# Patient Record
Sex: Female | Born: 1965 | Hispanic: Yes | Marital: Married
Health system: Southern US, Community
[De-identification: ages and names within clinical notes are randomized; demographics above are authoritative.]

---

## 2005-12-30 ENCOUNTER — Inpatient Hospital Stay: Payer: Self-pay | Admitting: Obstetrics and Gynecology

## 2008-10-07 ENCOUNTER — Ambulatory Visit: Payer: Self-pay | Admitting: Certified Nurse Midwife

## 2008-10-31 ENCOUNTER — Emergency Department: Payer: Self-pay | Admitting: Emergency Medicine

## 2008-11-10 ENCOUNTER — Ambulatory Visit: Payer: Self-pay | Admitting: Surgery

## 2008-11-11 ENCOUNTER — Ambulatory Visit: Payer: Self-pay | Admitting: Surgery

## 2009-07-14 IMAGING — US US PELV - US TRANSVAGINAL
1 series · 17 of 25 positions shown · non-contrast
Comparison: none

REASON FOR EXAM: PELVIC PAIN   needs interpreter office informed
COMMENTS:

[Series 1: us pelv - us transvaginal · 17 of 35 slices shown]
[im 1/35]
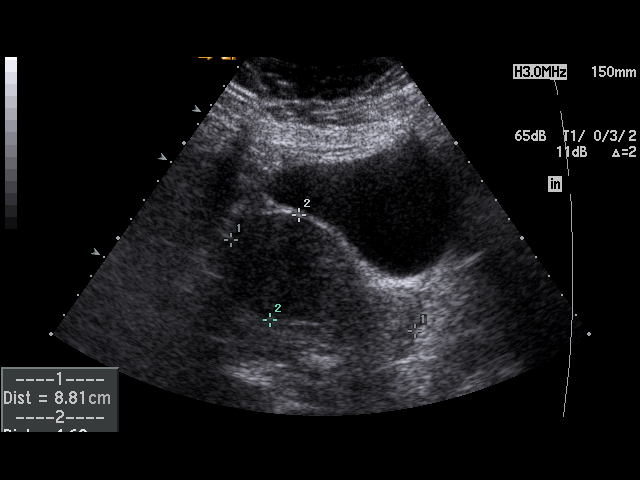
[im 3/35]
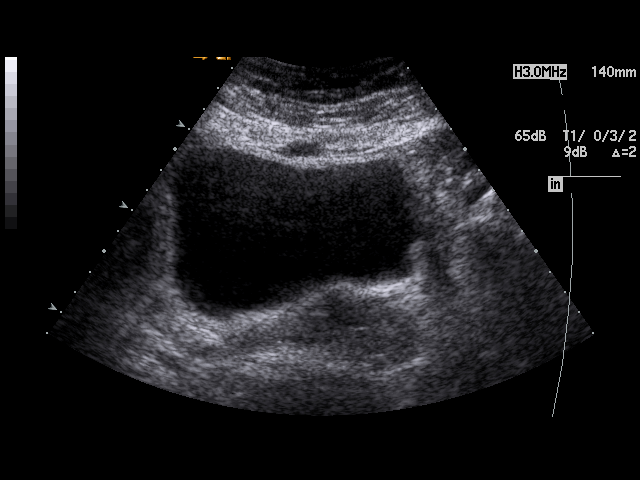
[im 5/35]
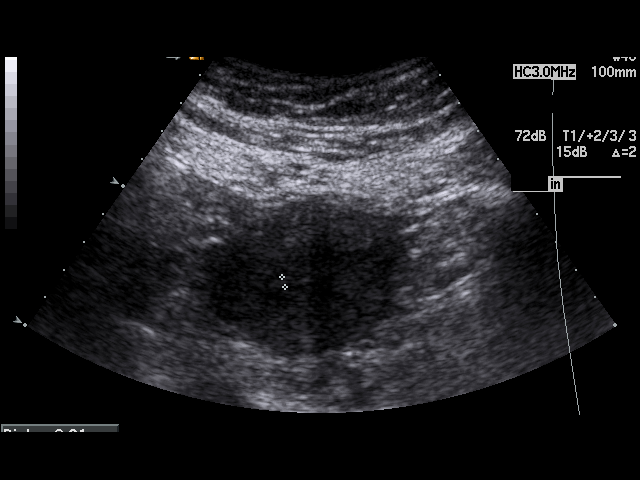
[im 8/35]
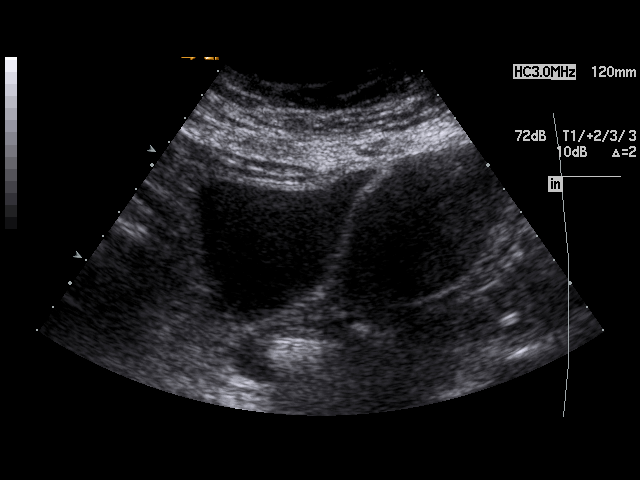
[im 9/35]
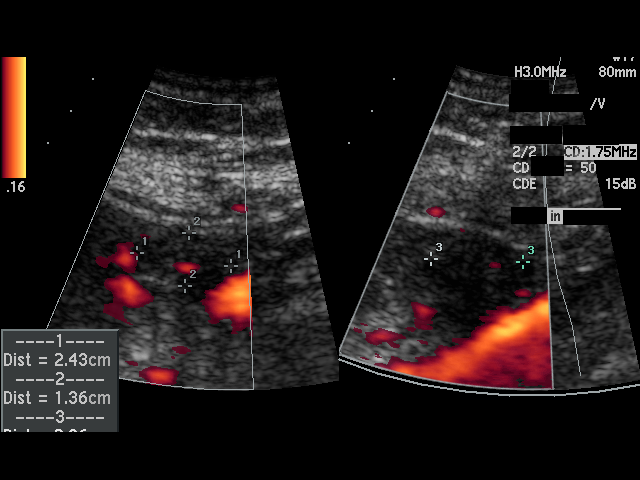
[im 12/35]
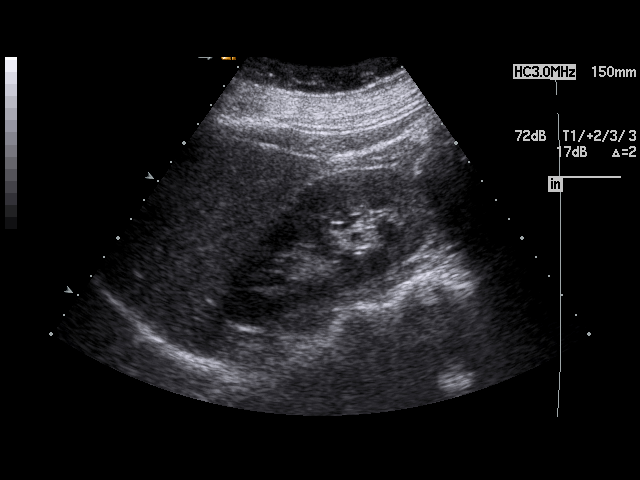
[im 13/35]
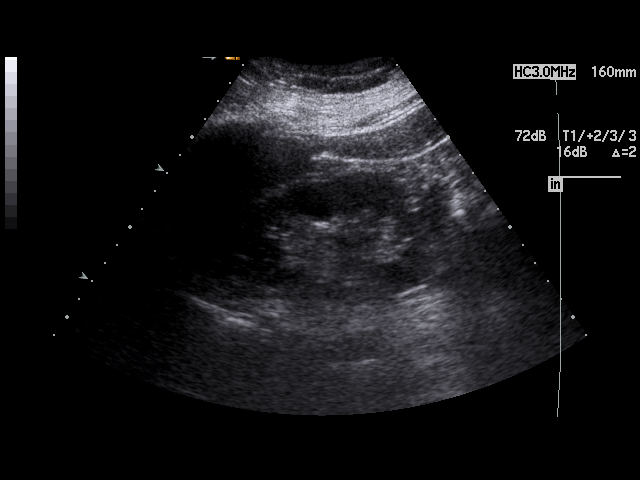
[im 16/35]
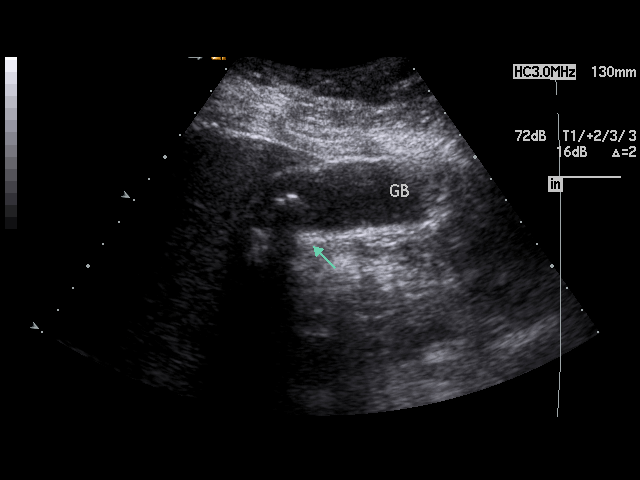
[im 18/35]
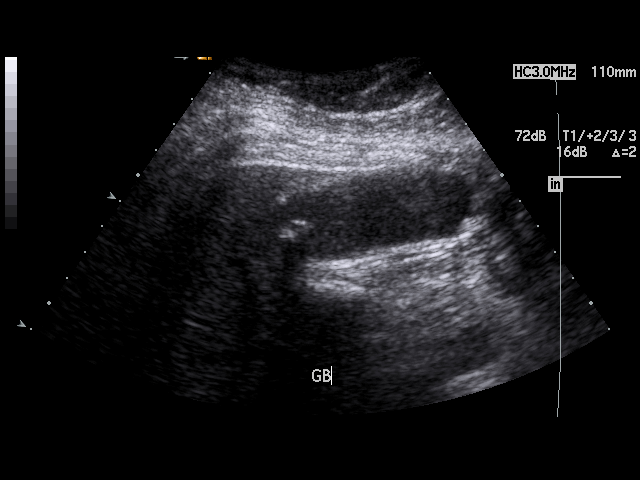
[im 19/35]
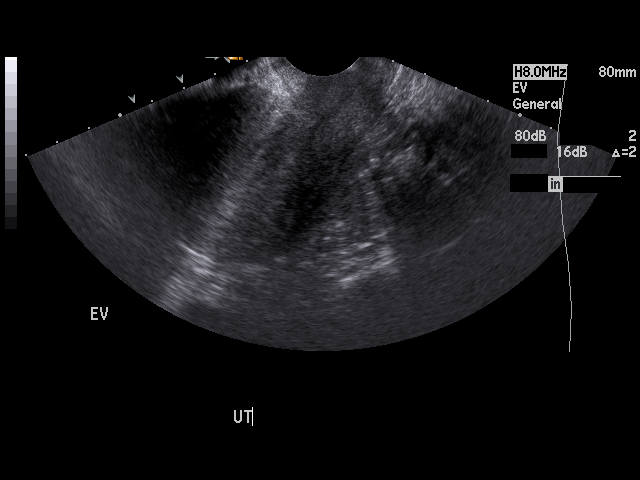
[im 22/35]
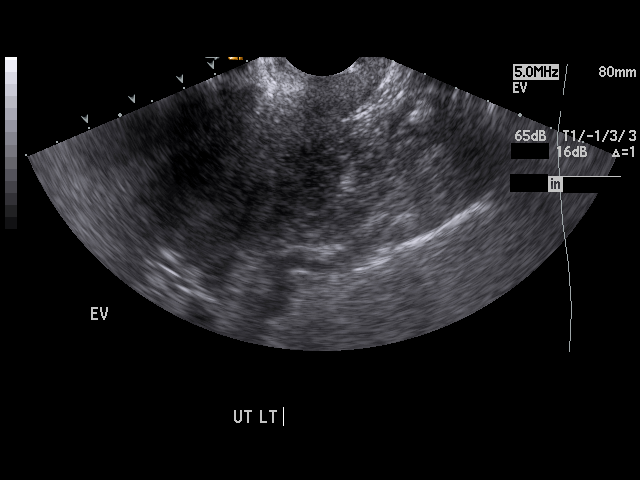
[im 23/35]
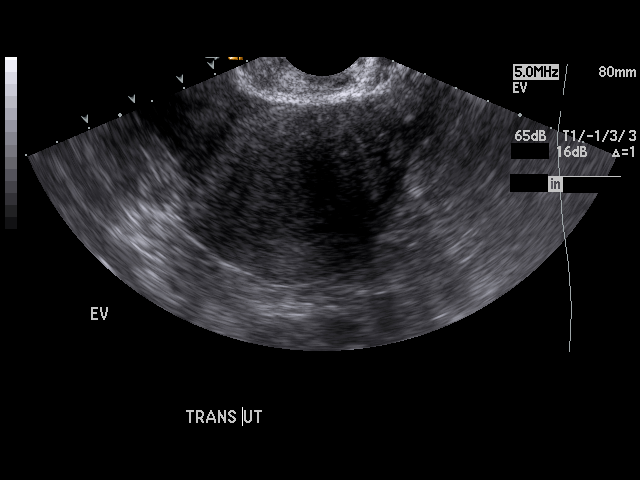
[im 26/35]
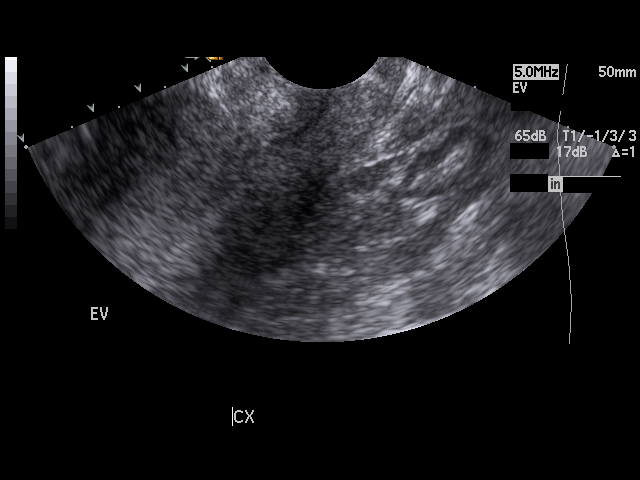
[im 27/35]
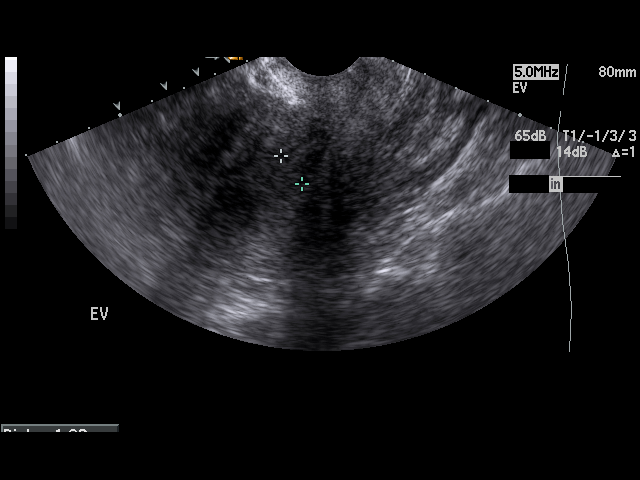
[im 30/35]
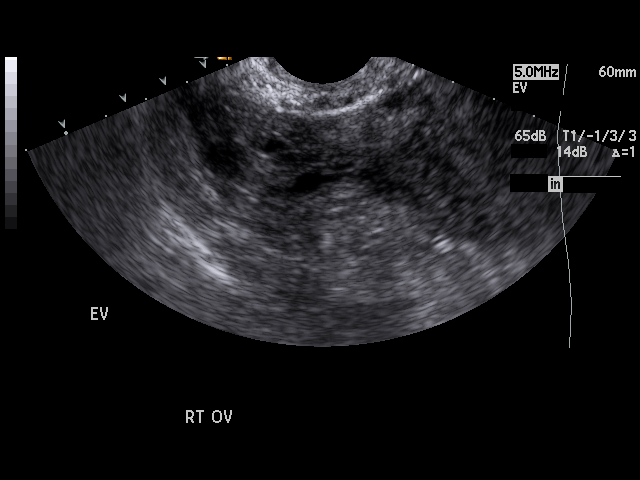
[im 32/35]
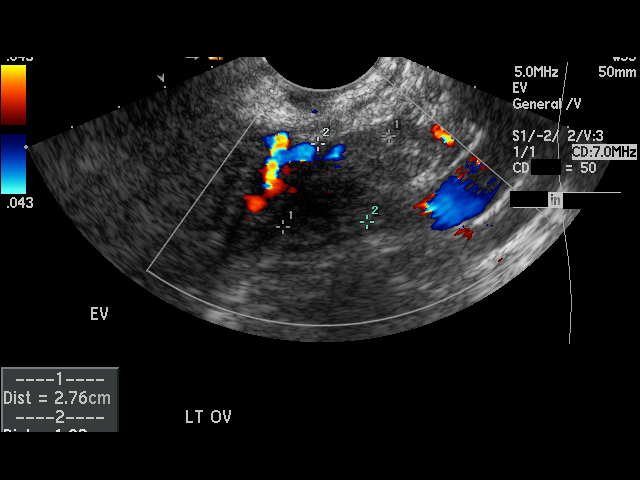
[im 35/35]
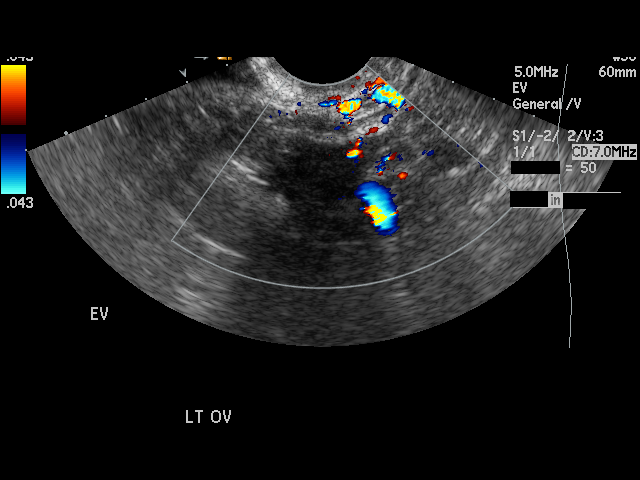

[17 of 25 positions shown; findings below may reference images not displayed]

PROCEDURE:     US  - US PELVIS EXAM W/TRANSVAGINAL  - October 07, 2008  [DATE]

RESULT:     The uterus measures 8.8 x 4.7 x 5.7 cm. Endometrial thickness of
10.2 mm is noted.  The RIGHT ovary measures 3.4 cm in maximum length and the
LEFT ovary measures 2.8 cm. A simple 0.9 cm cyst is noted in the RIGHT
ovary.  No hydronephrosis is noted. Incidental noted is made of gallstones.
IMPRESSION: 1. Endometrial thickening.  The endometrial thickening is mild and may be
cyclical. Follow-up pelvic ultrasound is suggested.

2. Simple cyst, RIGHT ovary.

3. Incidental note is made of gallstones.

## 2009-08-07 IMAGING — US ABDOMEN ULTRASOUND
1 series · 17 of 25 positions shown · non-contrast
Comparison: none

REASON FOR EXAM: ruq pain radiates to back
COMMENTS:   LMP: Now

PROCEDURE:     US  - US ABDOMEN GENERAL SURVEY  - October 31, 2008  [DATE]
RESULT:     Comparison: None
TECHNIQUE: Multiple gray-scale and color-flow Doppler images of the abdomen
are presented for review.

[Series 1: abdomen ultrasound · 17 of 77 slices shown]
[im 1/77]
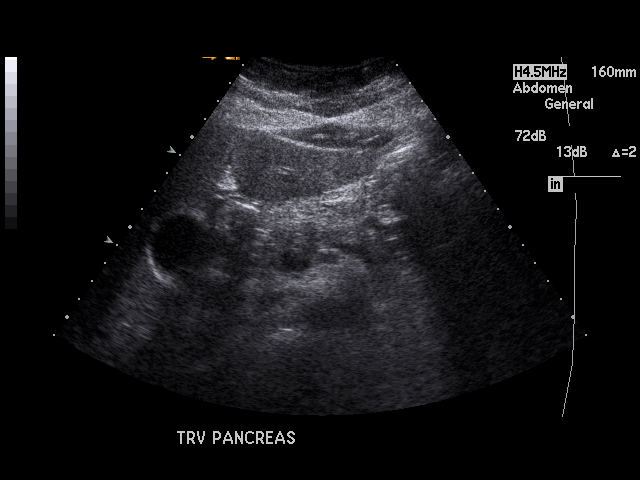
[im 7/77]
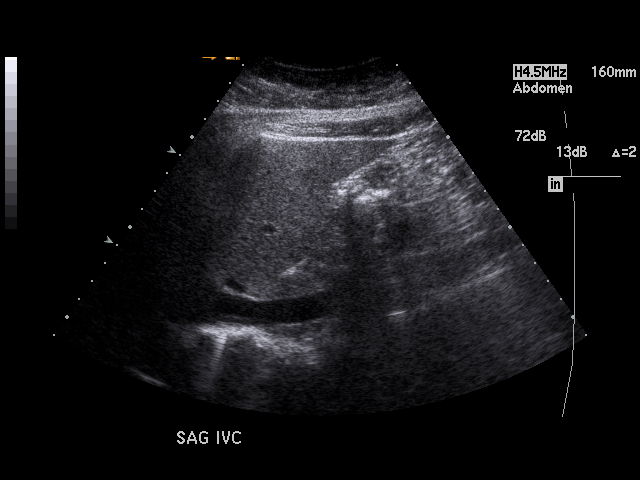
[im 10/77]
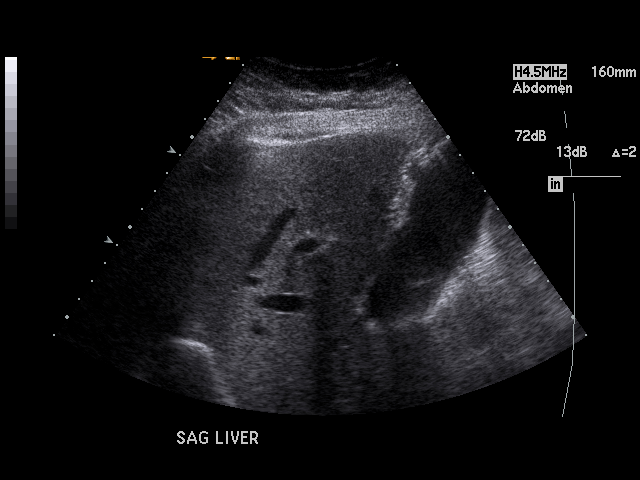
[im 16/77]
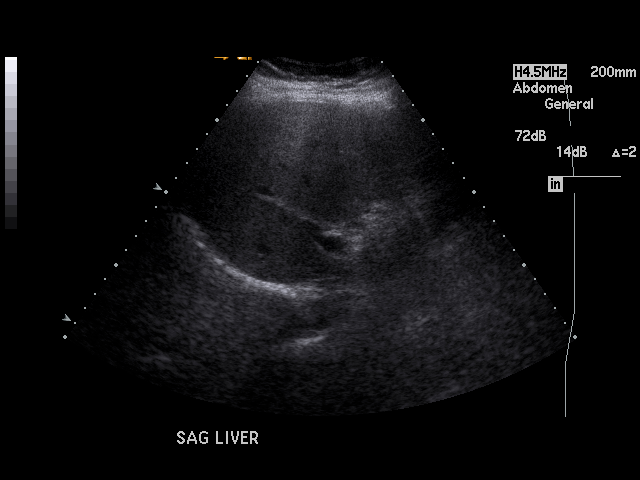
[im 20/77]
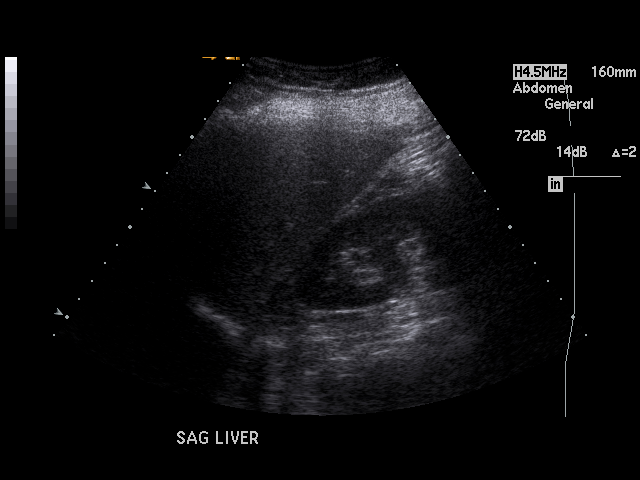
[im 26/77]
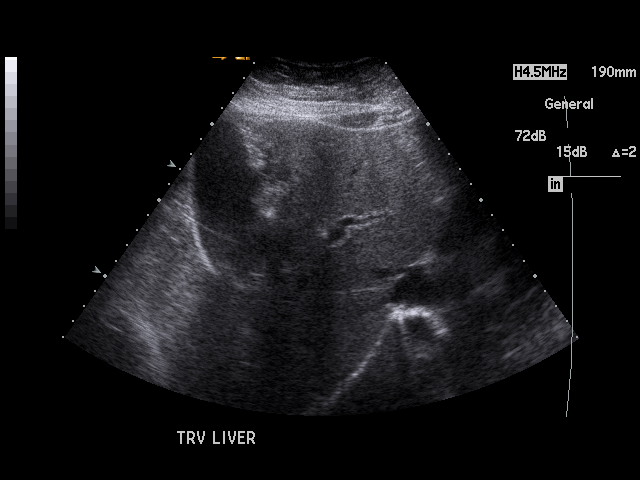
[im 29/77]
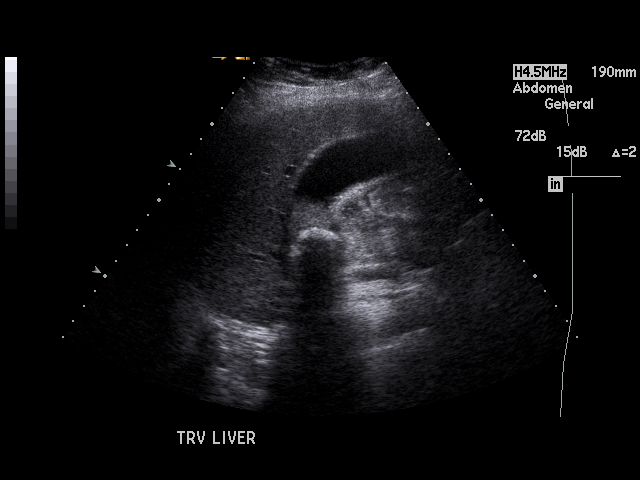
[im 35/77]
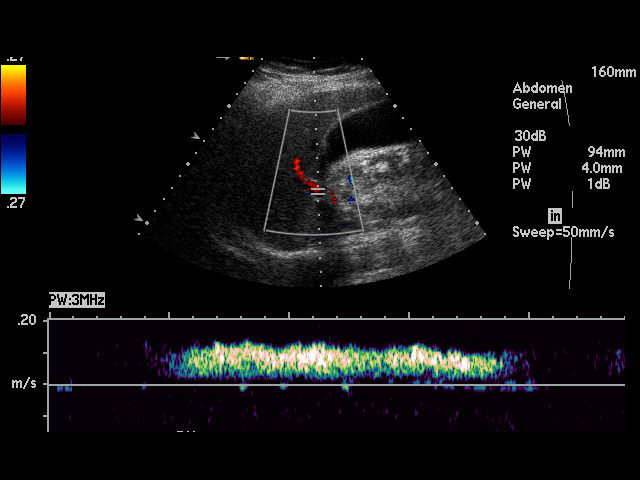
[im 39/77]
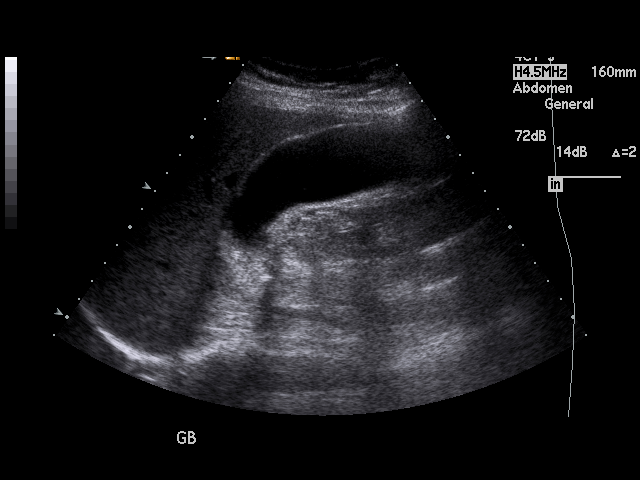
[im 42/77]
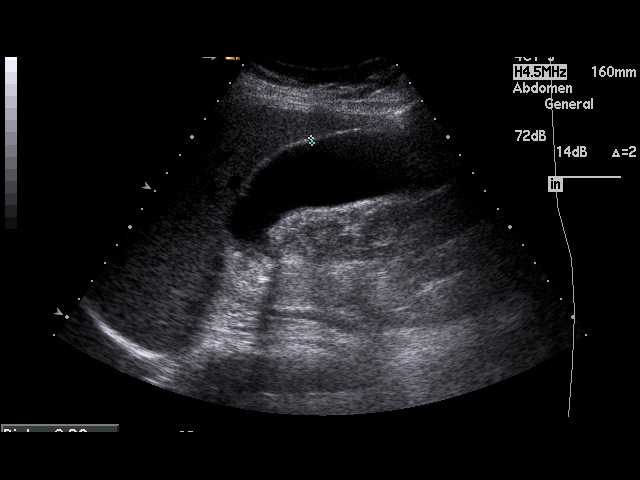
[im 48/77]
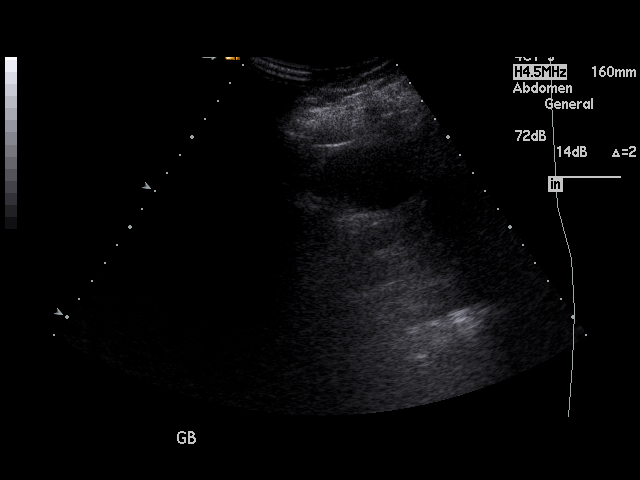
[im 51/77]
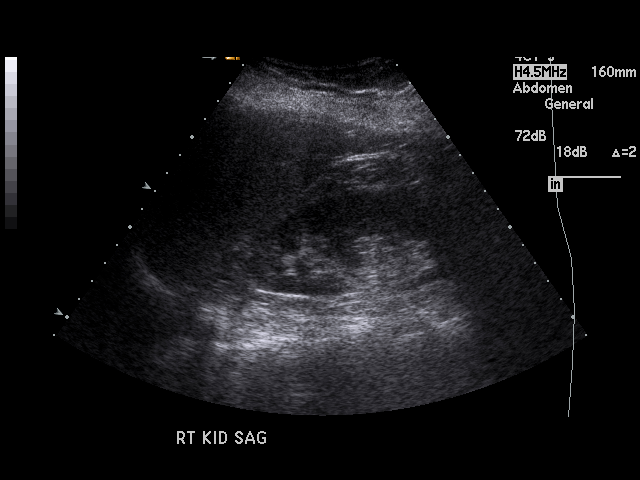
[im 58/77]
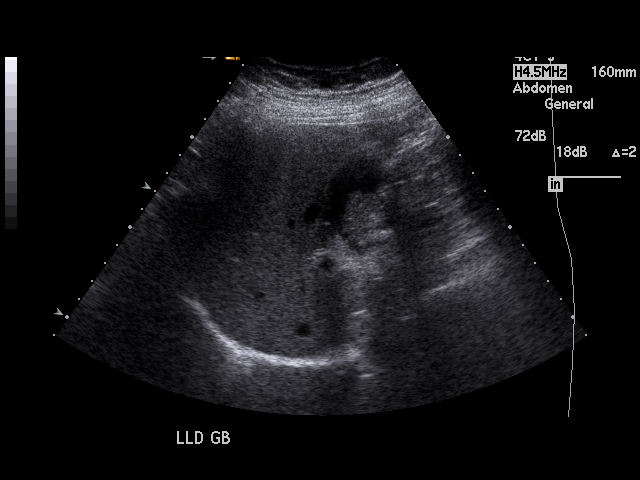
[im 61/77]
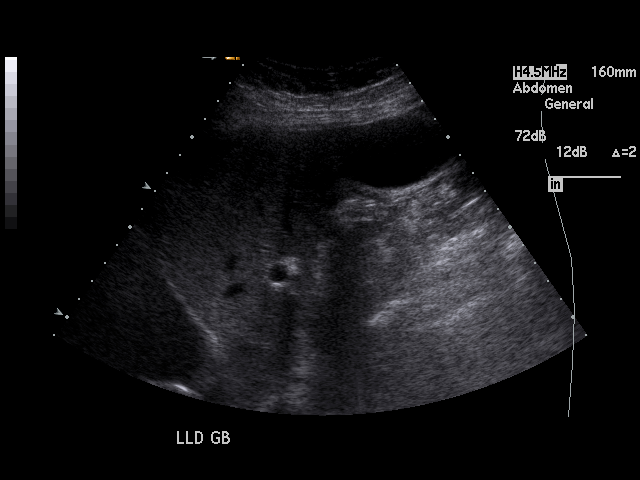
[im 67/77]
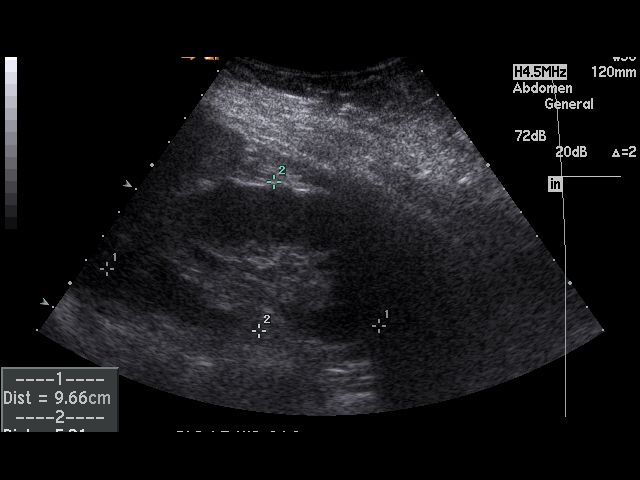
[im 70/77]
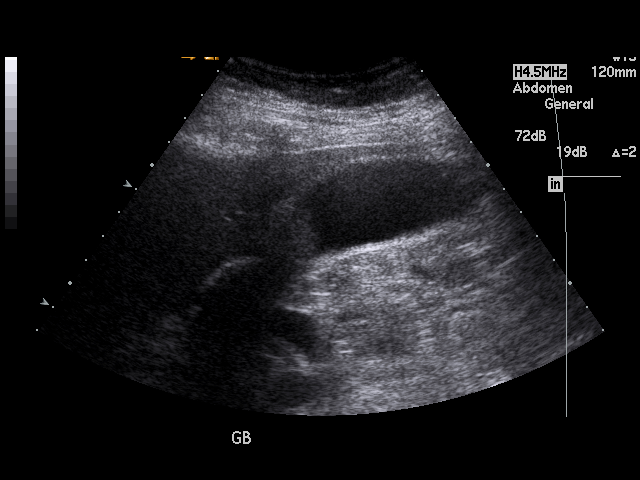
[im 77/77]
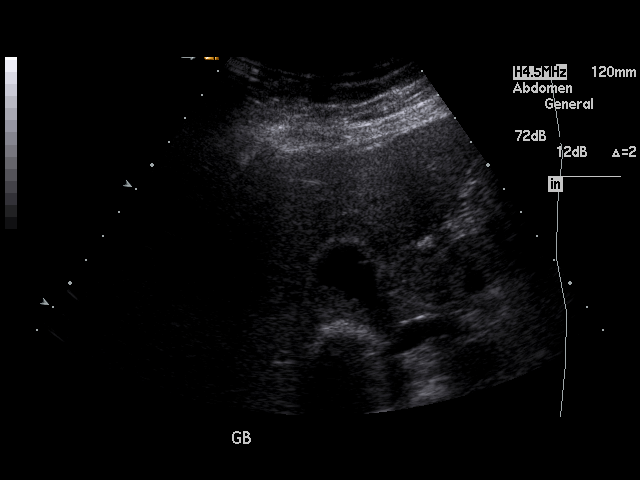

[17 of 25 positions shown; findings below may reference images not displayed]

FINDINGS: Visualized portions of the liver demonstrate normal echogenicity and normal
contours. The liver is without evidence of  focal hepatic lesion.

There multiple nonmobile cholelithiasis and gallbladder sludge. There is a
nonmobile gallstone in the gallbladder neck. There is no
choledocholithiasis. There is no intra- or extrahepatic biliary ductal
dilatation. The common duct measures 3.2 mm in maximal diameter. There is no
gallbladder wall thickening, pericholecystic fluid, or sonographic Murphy's
sign.

The pancreas is suboptimally visualized secondary to overlying bowel gas.
The spleen is unremarkable. Bilateral kidneys are normal in echogenicity and
size. The right kidney measures 9.8 cm. The left kidney measures 9.7 cm.
There are no renal calculi or hydronephrosis. The abdominal aorta and IVC
are unremarkable.
IMPRESSION: Cholelithiasis without sonographic evidence of acute cholecystitis.

## 2012-07-08 ENCOUNTER — Emergency Department: Payer: Self-pay | Admitting: Emergency Medicine

## 2013-09-06 ENCOUNTER — Emergency Department: Payer: Self-pay | Admitting: Emergency Medicine

## 2014-04-26 ENCOUNTER — Emergency Department: Payer: Self-pay | Admitting: Emergency Medicine

## 2014-06-07 ENCOUNTER — Emergency Department: Payer: Self-pay | Admitting: Emergency Medicine

## 2015-09-04 ENCOUNTER — Emergency Department
Admission: EM | Admit: 2015-09-04 | Discharge: 2015-09-04 | Disposition: A | Discharge status: Home / Self Care | Payer: Worker's Compensation | Attending: Emergency Medicine | Admitting: Emergency Medicine

## 2015-09-04 DIAGNOSIS — W25XXXA Contact with sharp glass, initial encounter: Secondary | ICD-10-CM | POA: Insufficient documentation

## 2015-09-04 DIAGNOSIS — S0502XA Injury of conjunctiva and corneal abrasion without foreign body, left eye, initial encounter: Secondary | ICD-10-CM | POA: Insufficient documentation

## 2015-09-04 DIAGNOSIS — Y92090 Kitchen in other non-institutional residence as the place of occurrence of the external cause: Secondary | ICD-10-CM | POA: Insufficient documentation

## 2015-09-04 DIAGNOSIS — Y9389 Activity, other specified: Secondary | ICD-10-CM | POA: Insufficient documentation

## 2015-09-04 DIAGNOSIS — Y990 Civilian activity done for income or pay: Secondary | ICD-10-CM | POA: Insufficient documentation

## 2015-09-04 MED ORDER — GENTAMICIN SULFATE 0.3 % OP SOLN: 2.0000 [drp] | OPHTHALMIC | Status: AC

## 2015-09-04 MED ORDER — KETOROLAC TROMETHAMINE 0.5 % OP SOLN: 1.0000 [drp] | Freq: Four times a day (QID) | OPHTHALMIC | Status: AC

## 2015-09-04 MED ORDER — FLUORESCEIN SODIUM 1 MG OP STRP
1.0000 | ORAL_STRIP | Freq: Once | OPHTHALMIC | Status: DC
  Filled 2015-09-04: qty 1

## 2015-09-04 NOTE — ED Notes (Addendum)
Possible foreign body L eye since yesterday. 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 States works at [employer].

## 2015-09-04 NOTE — ED Notes (Signed)
States she thinks she may have gotten something in left eye while washing glasses yesterday..Brandi Butler

## 2015-09-04 NOTE — Discharge Instructions (Signed)
Abrasin corneal (Corneal Abrasion) La crnea es la cubierta transparente en la parte anterior y central del ojo. Cuando mira la parte colorida del ojo (iris), est mirando a travs de la crnea. La crnea es un tejido muy delgado formado por varias capas. La capa superficial es una capa nica de clulas (epitelio corneal) y es uno de los tejidos ms sensibles del organismo. Si un rasguo o una lesin hacen que el epitelio corneal se desprenda, a esto se lo llama abrasin corneal. Si la lesin se extiende a los tejidos que se encuentran por debajo del epitelio, la afeccin se denomina lcera corneal. CAUSAS   Rasguos.  Traumatismos.  Cuerpo extrao en el ojo. Algunas personas tienen recurrencias de abrasiones en la zona de la lesin original incluso despus de que esta ha sanado (sndrome de erosin recurrente). El sndrome de erosin recurrente generalmente mejora y desaparece con el tiempo. SNTOMAS   Dolor en el ojo.  Dificultad o imposibilidad de Financial controllermantener abierto el ojo lesionado.  El ojo est muy sensible a la luz.  Las erosiones recurrentes tienden a ocurrir de Wellsite geologistmanera repentina, a primera hora de la maana, generalmente al despertar y abrir los ojos. DIAGNSTICO  Su mdico podr diagnosticar una abrasin corneal durante un examen ocular. Generalmente se coloca un tinte en el ojo, usando un gotero o una pequea tira de papel humedecida con sus lgrimas. Al examinar el ojo con una luz especial, aparece claramente la abrasin destacada por el tinte. TRATAMIENTO   Las abrasiones pequeas pueden tratarse con gotas o un ungento con antibitico.  Es posible que le apliquen un parche de presin sobre el ojo. Si este es [address] caso, siga las instrucciones de su mdico respecto de cundo Corporate treasurerretirar el parche. No conduzca ni opere maquinaria mientras lleve puesto el parche. Es difcil juzgar las Sales promotion account executivedistancias en estas condiciones. Si la abrasin se infecta y se disemina hacia tejidos ms profundos de  la crnea, puede producirse una lcera corneal. Esto es ms grave porque puede causar una cicatriz en la crnea. Las cicatrices en la crnea interfieren con el paso de la luz a travs de esta y causan prdida de la visin en el ojo involucrado. INSTRUCCIONES PARA EL CUIDADO EN EL HOGAR  Utilice los medicamentos o el ungento segn lo indicado. Utilice los medicamentos de venta libre o recetados para Primary school teachercalmar el dolor, el malestar o la fiebre, segn se lo indique el mdico.  No conduzca ni opere maquinarias mientras tenga el parche en el ojo. En estas condiciones no puede juzgar correctamente las distancias.  Si el mdico le ha dado fecha para una visita de control, es importante que concurra. No cumplir con las visitas de control puede dar como resultado una infeccin grave en el ojo o una prdida permanente de la visin. Si hay algn problema que le impide acudir a la cita, avsele a su mdico. SOLICITE ATENCIN MDICA SI:   Electronics engineeriente dolor, tiene sensibilidad a la luz y experimenta una sensacin de picazn en un ojo o en ambos.  El parche de presin se afloja continuamente y puede parpadear debajo del parche despus del tratamiento.  Aparece algn tipo de secrecin en el ojo despus del tratamiento o si despierta con los prpados pegados por la maana.  Por la Miniermaana, siente los mismos sntomas que sinti en los [address]  [phone] en que tuvo la abrasin original, algunas semanas o meses despus de la curacin.   Esta informacin no tiene Theme park managercomo fin reemplazar el consejo del mdico. Asegrese de hacerle  al mdico cualquier pregunta que tenga.   Document Released: 04/01/2005 Document Revised: 12/21/2014 Elsevier Interactive Patient Education [employer]2016 Elsevier Inc.   You may have small scratch to the eyelid or eyeball. Use the eye drops as directed. Follow-up with Dr. Caprice RedPorfilo as needed.  ================================================================================ Usted puede tener un pequeo rasguo en el  parpado o en el ojo. Use las gotas para los ojos como se le indicaron. De seguimiento a su caso con el Dr. Caprice RedPorfilo si lo necesita.

## 2015-09-07 NOTE — ED Provider Notes (Signed)
[facility] Emergency Department Provider Note ____________________________________________  Time seen: 1754  I have reviewed the triage vital signs and the nursing notes.  HISTORY  Chief Complaint  Eye Pain  History limited by Spanish language. Interpreter Nurse, children's(Isias) present during interview and exam.   HPI Brandi Butler is a 50 y.o. female presents to the ED for evaluation of foreign body sensation to the left eye. 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 She describes racking the flaps of drinking glasses when one of the racks had some broken glass. She describes a sensation of glass from the dishwasher rack fluid to her face and left eye. She describes flushing area yesterday but continues to have a 4 body sensation to the eye. She denies any other injury at this time.  No past medical history on file.  There are no active problems to display for this patient.   No past surgical history on file.  Current Outpatient Rx  Name  Route  Sig  Dispense  Refill  . gentamicin (GARAMYCIN) 0.3 % ophthalmic solution   Left Eye   Place 2 drops into the left eye every 4 (four) hours.   5 mL   0   . ketorolac (ACULAR) 0.5 % ophthalmic solution   Left Eye   Place 1 drop into the left eye 4 (four) times daily.   5 mL   0    Allergies Penicillins  No family history on file.  Social History Social History  Substance Use Topics  . Smoking status: Not on file  . Smokeless tobacco: Not on file  . Alcohol Use: Not on file   Review of Systems  Constitutional: Negative for fever. Eyes: Negative for visual changes. ENT: Negative for sore throat. Left eye FBS as above  Skin: Negative for rash. Neurological: Negative for headaches, focal weakness or numbness. ____________________________________________  PHYSICAL EXAM:  VITAL SIGNS: ED Triage Vitals  Enc Vitals Group     BP 09/04/15 1722 110/64 mmHg    Pulse Rate 09/04/15 1722 77     Resp 09/04/15 1722 18     Temp 09/04/15 1722 98.3 F (36.8 C)     Temp Source 09/04/15 1722 Oral     SpO2 09/04/15 1722 96 %     Weight 09/04/15 1722 160 lb (72.576 kg)     Height --      Head Cir --      Peak Flow --      Pain Score 09/04/15 1724 5     Pain Loc --      Pain Edu? --      Excl. in GC? --    Constitutional: Alert and oriented. Well appearing and in no distress. Head: Normocephalic and atraumatic. Eyes: Conjunctivae are normal. PERRL. Normal extraocular movements. No gross foreign body on inspection. Upper lid everted for exam. No fluorescein dye uptake.  Ears: Canals clear. TMs intact bilaterally. Nose: No congestion/rhinorrhea. Mouth/Throat: Mucous membranes are moist. Neck: Supple. No thyromegaly. Neurologic:  Normal gait without ataxia. Normal speech and language. No gross focal neurologic deficits are appreciated. Skin:  Skin is warm, dry and intact. No rash noted. ____________________________________________  PROCEDURES  Tetracaine iii gtts OU ____________________________________________  INITIAL IMPRESSION / ASSESSMENT AND PLAN / ED COURSE  Patient with a continued foreign body sensation to the left eye without obvious indication of corneal abrasion or gross foreign body. She'll be discharged with prescriptions for Acular and gentamicin to  use as directed. She is referred [facility] for ongoing symptom management. ____________________________________________  FINAL CLINICAL IMPRESSION(S) / ED DIAGNOSES  Final diagnoses:  Corneal abrasion, left, initial encounter     Lissa Hoard, PA-C 09/07/15 2001  Sharman Cheek, MD 09/12/15 [phone]

## 2016-10-15 NOTE — ED Provider Notes (Signed)
 History of Present Illness 51 y.o. female with no significant PMH presents to the emergency department with left foot pain. Patient reports left foot pain x2 weeks after foot inversion when coming down the stairs. She is able to ambulate, however pain worsens with standing. Patient denies taking any medication for the pain or using ice for swelling. Patient denies any other medical concerns at this time.   Past Medical History Past Medical History:  Diagnosis Date  . GERD (gastroesophageal reflux disease)   . Perforated tympanic membrane    Chronic    Patient Active Problem List   Diagnosis Date Noted  . Right upper quadrant abdominal pain 12/01/2014  . GERD (gastroesophageal reflux disease)   . AGUS favor benign 02/17/2013   Past Surgical History Past Surgical History:  Procedure Laterality Date  . CESAREAN SECTION    . CHOLECYSTECTOMY     Social History Social History   Social History  . Marital status: Married    Spouse name: N/A  . Number of children: N/A  . Years of education: N/A   Social History Main Topics  . Smoking status: Never Smoker  . Smokeless tobacco: Never Used  . Alcohol use No  . Drug use: No  . Sexual activity: Not Asked   Other Topics Concern  . None   Social History Narrative  . None   Family History Family History  Problem Relation Age of Onset  . Diabetes Father    Medications Current Meds:No current facility-administered medications for this encounter.   Current Outpatient Prescriptions:  .  conjugated estrogens (PREMARIN) 0.625 mg/gram vaginal cream, Insert 1 g into the vagina Two (2) times a week., Disp: 30 g, Rfl: 11 .  ranitidine (ZANTAC) 150 MG tablet, Take 1 tablet (150 mg total) by mouth Two (2) times a day., Disp: 60 tablet, Rfl: 1 Allergies: Allergies  Allergen Reactions  . Penicillins Rash    ROS: Constitutional: Negative HENT: Negative Eyes: Negative Respiratory: Negative Cardiovascular: Negative Gastrointestinal:  Negative Genitourinary: Negative Skin: Negative  Musculoskeletal: See HPI Neurological: Negative All other systems have been reviewed with the patient (or family) and are negative.  Physical Examination: Vital Signs: BP 124/85   Pulse 73   Temp 37 C (98.6 F) (Temporal)   Resp 18   SpO2 98%  General: Patient is awake and alert. Patient is oriented x3. Patient is nontoxic appearing. Head: The head is normocephalic and atraumatic. Eyes: The pupils are equal sized and reactive to light. The extraocular muscles are intact. ENT: Patient's airway is intact. Neck: Supple. Chest/Respiratory: Respiratory effort is normal. The lungs are clear to auscultation bilaterally.  Musculoskeletal: Mild edema to dorsum of left foot with tenderness along the fifth metatarsal and calcaneus. No other bony tenderness to medial or lateral malleoli or proximal fibula. ROM of knee, ankle, and toes intact. Brisk cap refill.  No midfoot instability.  Skin: The patient's skin is intact.  Neurologic: The neurological exam shows no focal deficits. Distal sensory intact to the left foot.   Initial Assessment and Plan: Assessment: Brandi Butler is a 51 y.o. female with left foot pain x2 weeks after foot inversion when coming down the stairs. This is most consistent with 5th metatarsal fracture vs foot sprain. No evidence suggestive of ankle fracture/sprain, proximal fibular fracture, knee injury, Lisfranc injury. Neurovascularly intact.  Plan: Plan for Foot XR and reassess. Offered analgesia, but declined.   ED Course: Imaging:XR: Xr Foot 3 Or More Views Left No acute fracture or dislocation  involving the left foot.   Reviewed findings with patient. Advised RICE, PCP f/u. DC from ED in stable condition.  Janelle Bludorn, PA-C  ______________________________________________________  Documentation assistance was provided by Marolyn Fly, Scribe, on October 15, 2016 at 6:28 PM for [place], [place].   A  scribe was used when documenting this visit. I agree with the above documentation. Signed by  Hart JONELLE Mannheim on  October 15, 2016 at 7:15 PM       Hart Dines [place], [place] 10/15/16 2141

## 2017-12-17 ENCOUNTER — Encounter (INDEPENDENT_AMBULATORY_CARE_PROVIDER_SITE_OTHER): Payer: Self-pay

## 2017-12-17 ENCOUNTER — Ambulatory Visit
Admission: RE | Admit: 2017-12-17 | Discharge: 2017-12-17 | Disposition: A | Discharge status: Home / Self Care | Payer: Self-pay | Source: Ambulatory Visit | Attending: Oncology | Admitting: Oncology

## 2017-12-17 ENCOUNTER — Ambulatory Visit: Payer: Self-pay | Attending: Oncology

## 2017-12-17 VITALS — BP 128/83 | HR 78 | Temp 98.2°F | Ht 60.0 in | Wt 151.0 lb

## 2017-12-17 DIAGNOSIS — Z0000 Encounter for general adult medical examination without abnormal findings: Secondary | ICD-10-CM

## 2017-12-17 NOTE — Progress Notes (Addendum)
  Subjective:     Patient ID: Brandi Butler, female   DOB: 12-30-1965, 52 y.o.   MRN: [id number]  HPI   Review of Systems     Objective:   Physical Exam  Pulmonary/Chest: Right breast exhibits no inverted nipple, no mass, no nipple discharge, no skin change and no tenderness. Left breast exhibits no inverted nipple, no mass, no nipple discharge, no skin change and no tenderness. Breasts are symmetrical.       Assessment:     52 year old patient presents for [facility] clinic visit.  Patient screened, and meets BCCCP eligibility.  Patient does not have insurance, Medicare or Medicaid.  Handout given on Affordable Care Act.   Instructed patient on breast self awareness using teach back method.  Clinical breast exam unremarkable.  No mass or lump palpated.  Jaqui Laukaitis interpreted exam.  Patient works Geographical information systems officer candles x 1 year.    Plan:     Sent for bilateral screening mammogram.

## 2017-12-24 NOTE — Progress Notes (Signed)
Letter mailed from [facility] to notify of normal mammogram results.  Patient to return in one year for annual screening.  Copy to [facility]. 

## 2022-02-27 ENCOUNTER — Encounter: Payer: Self-pay | Admitting: Primary Care

## 2023-04-02 ENCOUNTER — Ambulatory Visit: Payer: Self-pay

## 2023-04-02 ENCOUNTER — Ambulatory Visit (LOCAL_COMMUNITY_HEALTH_CENTER): Payer: Self-pay

## 2023-04-02 DIAGNOSIS — Z719 Counseling, unspecified: Secondary | ICD-10-CM

## 2023-04-02 DIAGNOSIS — Z23 Encounter for immunization: Secondary | ICD-10-CM

## 2023-04-02 NOTE — Progress Notes (Signed)
In nurse clinic for immunizations needed for immigration. RN explained recommended vaccines and schedule to patient; patient agreed to receive vaccines. Patient reports she had already received Flu vaccine at [facility] and will get documentation from the clinic. Voices no concerns. VIS reviewed and given to patient. Patient paid for private Polio and Varicella. Per ACIP guidelines, patient eligible to receive a state-supplied Covid, MMR, Tdap, and Twinrix vaccines. Vaccines (Covid Moderna Spikevax 12+, Twinrix, MMR, Polio, Tdap, Varicella) tolerated well; no issues noted. Patient monitored after vaccines; no problems. [facility] updated and copies given to patient.   Interpreter, Osborn Coho, helped during this visit.   Abagail Kitchens, RN

## 2023-04-30 ENCOUNTER — Ambulatory Visit: Payer: Self-pay

## 2023-05-15 ENCOUNTER — Encounter: Payer: Self-pay | Admitting: Primary Care

## 2023-06-25 ENCOUNTER — Other Ambulatory Visit: Payer: Self-pay

## 2023-06-25 DIAGNOSIS — Z1231 Encounter for screening mammogram for malignant neoplasm of breast: Secondary | ICD-10-CM

## 2023-07-11 ENCOUNTER — Telehealth: Payer: Self-pay

## 2023-07-11 NOTE — Telephone Encounter (Signed)
 Pt son called and was with the pt, to r/s the bcccp clinic appt on Monday 3/31 to a different day within the next two weeks. Call back number is [phone] and please use interpreter services as pt only speaks spanish.  Thank you

## 2023-07-14 ENCOUNTER — Ambulatory Visit: Payer: Self-pay | Attending: Obstetrics and Gynecology

## 2023-07-14 ENCOUNTER — Ambulatory Visit: Payer: Self-pay

## 2024-03-04 ENCOUNTER — Emergency Department
Admission: EM | Admit: 2024-03-04 | Discharge: 2024-03-04 | Disposition: A | Discharge status: Home / Self Care | Payer: Self-pay | Attending: Emergency Medicine | Admitting: Emergency Medicine

## 2024-03-04 ENCOUNTER — Emergency Department: Payer: Self-pay

## 2024-03-04 DIAGNOSIS — K2900 Acute gastritis without bleeding: Secondary | ICD-10-CM | POA: Insufficient documentation

## 2024-03-04 LAB — URINALYSIS, ROUTINE W REFLEX MICROSCOPIC
Bilirubin Urine: NEGATIVE
Glucose, UA: NEGATIVE mg/dL
Hgb urine dipstick: NEGATIVE
Ketones, ur: NEGATIVE mg/dL
Leukocytes,Ua: NEGATIVE
Nitrite: NEGATIVE
Protein, ur: NEGATIVE mg/dL
Specific Gravity, Urine: 1.016 (ref 1.005–1.030)
pH: 8 (ref 5.0–8.0)

## 2024-03-04 LAB — COMPREHENSIVE METABOLIC PANEL WITH GFR
ALT: 70 U/L — ABNORMAL HIGH (ref 0–44)
AST: 45 U/L — ABNORMAL HIGH (ref 15–41)
Albumin: 4.5 g/dL (ref 3.5–5.0)
Alkaline Phosphatase: 116 U/L (ref 38–126)
Anion gap: 11 (ref 5–15)
BUN: 13 mg/dL (ref 6–20)
CO2: 23 mmol/L (ref 22–32)
Calcium: 9 mg/dL (ref 8.9–10.3)
Chloride: 105 mmol/L (ref 98–111)
Creatinine, Ser: 0.76 mg/dL (ref 0.44–1.00)
GFR, Estimated: >60 mL/min (ref >60)
Glucose, Bld: 143 mg/dL — ABNORMAL HIGH (ref 70–99)
Potassium: 4.1 mmol/L (ref 3.5–5.1)
Sodium: 140 mmol/L (ref 135–145)
Total Bilirubin: 0.6 mg/dL (ref 0.0–1.2)
Total Protein: 8.2 g/dL — ABNORMAL HIGH (ref 6.5–8.1)

## 2024-03-04 LAB — CBC
HCT: 45.4 % (ref 36.0–46.0)
Hemoglobin: 15.1 g/dL — ABNORMAL HIGH (ref 12.0–15.0)
MCH: 29.9 pg (ref 26.0–34.0)
MCHC: 33.3 g/dL (ref 30.0–36.0)
MCV: 89.9 fL (ref 80.0–100.0)
Platelets: 255 K/uL (ref 150–400)
RBC: 5.05 MIL/uL (ref 3.87–5.11)
RDW: 12.4 % (ref 11.5–15.5)
WBC: 8.1 K/uL (ref 4.0–10.5)
nRBC: 0 % (ref 0.0–0.2)

## 2024-03-04 LAB — LIPASE, BLOOD: Lipase: 34 U/L (ref 11–51)

## 2024-03-04 MED ORDER — DICYCLOMINE HCL 10 MG PO CAPS: 10.0000 mg | ORAL_CAPSULE | Freq: Three times a day (TID) | ORAL | 0 refills | Status: DC

## 2024-03-04 MED ORDER — KETOROLAC TROMETHAMINE 30 MG/ML IJ SOLN
30.0000 mg | Freq: Once | INTRAMUSCULAR | Status: AC
  Administered 2024-03-04: 30 mg via INTRAVENOUS
  Filled 2024-03-04: qty 1

## 2024-03-04 MED ORDER — SODIUM CHLORIDE 0.9 % IV SOLN: Freq: Once | INTRAVENOUS | Status: AC

## 2024-03-04 NOTE — ED Triage Notes (Addendum)
 Pt presents to the ED via POV from home with nausea, vomiting, and dizziness that started this morning. Pt reports mild abdominal pain that she describes as a burning sensation. Pt reports a headache.   Requires translator

## 2024-03-04 NOTE — ED Provider Notes (Signed)
 [facility] Provider Note    Event Date/Time   First MD Initiated Contact with Patient 03/04/24 1100     (approximate)   History   Dizziness   HPI  Antoniette Falan Hensler is a 58 y.o. female who presents with complaints of nausea vomiting epigastric discomfort and some mild dizziness.  She reports she has had this in the past but it seemed to have worsened this morning.  No history of abdominal surgery reported.  No fevers.  Has not take anything for this     Physical Exam   Triage Vital Signs: ED Triage Vitals [03/04/24 0932]  Encounter Vitals Group     BP (!) 149/91     Girls Systolic BP Percentile      Girls Diastolic BP Percentile      Boys Systolic BP Percentile      Boys Diastolic BP Percentile      Pulse Rate 86     Resp 18     Temp 98.4 F (36.9 C)     Temp Source Oral     SpO2 97 %     Weight 72.6 kg (160 lb)     Height 1.524 m (5')     Head Circumference      Peak Flow      Pain Score 10     Pain Loc      Pain Education      Exclude from Growth Chart     Most recent vital signs: Vitals:   03/04/24 1252 03/04/24 1436  BP:  131/86  Pulse:  85  Resp:  18  Temp:  98.1 F (36.7 C)  SpO2: 97% 98%     General: Awake, no distress.  CV:  Good peripheral perfusion.  Regular rate and rhythm Resp:  Normal effort.  Clear to auscultation bilaterally Abd:  No distention.  Tenderness in the epigastrium Other:     ED Results / Procedures / Treatments   Labs (all labs ordered are listed, but only abnormal results are displayed) Labs Reviewed  COMPREHENSIVE METABOLIC PANEL WITH GFR - Abnormal; Notable for the following components:      Result Value   Glucose, Bld 143 (*)    Total Protein 8.2 (*)    AST 45 (*)    ALT 70 (*)    All other components within normal limits  CBC - Abnormal; Notable for the following components:   Hemoglobin 15.1 (*)    All other components within normal limits  URINALYSIS, ROUTINE W REFLEX  MICROSCOPIC - Abnormal; Notable for the following components:   Color, Urine YELLOW (*)    APPearance HAZY (*)    All other components within normal limits  LIPASE, BLOOD     EKG  ED ECG REPORT I, Lamar Price, the attending physician, personally viewed and interpreted this ECG.  Date: 03/04/2024  Rhythm: normal sinus rhythm QRS Axis: normal Intervals: normal ST/T Wave abnormalities: normal Narrative Interpretation: no evidence of acute ischemia   RADIOLOGY Ultrasound viewed interpret by me, no gallbladder seen    PROCEDURES:  Critical Care performed:   Procedures   MEDICATIONS ORDERED IN ED: Medications  0.9 %  sodium chloride infusion (0 mLs Intravenous Stopped 03/04/24 1505)  ketorolac  (TORADOL ) 30 MG/ML injection 30 mg (30 mg Intravenous Given 03/04/24 1258)     IMPRESSION / MDM / ASSESSMENT AND PLAN / ED COURSE  I reviewed the triage vital signs and the nursing notes. Patient's presentation is most consistent  with acute presentation with potential threat to life or bodily function.  Patient presents with abdominal pain as detailed above, differential includes gastritis, cholecystitis, pancreatitis.  She states that she has had stones removed with lasers in the past.  I asked her if this was related to kidney stones but she does not know, no clear surgical scars to suggest cholecystectomy  Will treat with IV Toradol , IV fluids, obtain ultrasound reevaluate.  Ultrasound demonstrates hepatic steatosis, patient feeling better after IV fluids and treatment.  No indication for admission at this time, appropriate for discharge with outpatient follow-up, she agrees with this plan      FINAL CLINICAL IMPRESSION(S) / ED DIAGNOSES   Final diagnoses:  Acute gastritis without hemorrhage, unspecified gastritis type     Rx / DC Orders   ED Discharge Orders          Ordered    dicyclomine (BENTYL) 10 MG capsule  3 times daily before meals & bedtime         03/04/24 1442             Note:  This document was prepared using Dragon voice recognition software and may include unintentional dictation errors.   Arlander Charleston, MD 03/04/24 [phone]

## 2024-03-05 ENCOUNTER — Telehealth: Payer: Self-pay | Admitting: Emergency Medicine

## 2024-03-05 MED ORDER — DICYCLOMINE HCL 10 MG PO CAPS: 10.0000 mg | ORAL_CAPSULE | Freq: Three times a day (TID) | ORAL | 0 refills | Status: AC

## 2024-03-05 NOTE — Telephone Encounter (Signed)
 Change pharmacy for bentyl
